# Patient Record
Sex: Male | Born: 1975 | Hispanic: Yes | Marital: Single | Smoking: Never smoker
Health system: Southern US, Community
[De-identification: ages and names within clinical notes are randomized; demographics above are authoritative.]

## PROBLEM LIST (undated history)

## (undated) DIAGNOSIS — J45909 Unspecified asthma, uncomplicated: Secondary | ICD-10-CM

---

## 2017-05-02 ENCOUNTER — Emergency Department: Payer: Worker's Compensation

## 2017-05-02 ENCOUNTER — Emergency Department
Admission: EM | Admit: 2017-05-02 | Discharge: 2017-05-02 | Disposition: A | Discharge status: Home / Self Care | Payer: Worker's Compensation | Attending: Emergency Medicine | Admitting: Emergency Medicine

## 2017-05-02 ENCOUNTER — Other Ambulatory Visit: Payer: Self-pay

## 2017-05-02 DIAGNOSIS — S61219A Laceration without foreign body of unspecified finger without damage to nail, initial encounter: Secondary | ICD-10-CM

## 2017-05-02 DIAGNOSIS — Y929 Unspecified place or not applicable: Secondary | ICD-10-CM | POA: Insufficient documentation

## 2017-05-02 DIAGNOSIS — S67196A Crushing injury of right little finger, initial encounter: Secondary | ICD-10-CM | POA: Diagnosis present

## 2017-05-02 DIAGNOSIS — S6710XA Crushing injury of unspecified finger(s), initial encounter: Secondary | ICD-10-CM

## 2017-05-02 DIAGNOSIS — Y939 Activity, unspecified: Secondary | ICD-10-CM | POA: Diagnosis not present

## 2017-05-02 DIAGNOSIS — Y990 Civilian activity done for income or pay: Secondary | ICD-10-CM | POA: Diagnosis not present

## 2017-05-02 DIAGNOSIS — W230XXA Caught, crushed, jammed, or pinched between moving objects, initial encounter: Secondary | ICD-10-CM | POA: Insufficient documentation

## 2017-05-02 DIAGNOSIS — S61216A Laceration without foreign body of right little finger without damage to nail, initial encounter: Secondary | ICD-10-CM | POA: Insufficient documentation

## 2017-05-02 MED ORDER — BUPIVACAINE HCL 0.5 % IJ SOLN
50.0000 mL | Freq: Once | INTRAMUSCULAR | Status: AC
  Administered 2017-05-02: 30 mL
  Filled 2017-05-02: qty 50

## 2017-05-02 MED ORDER — CEPHALEXIN 500 MG PO CAPS: 500.0000 mg | ORAL_CAPSULE | Freq: Two times a day (BID) | ORAL | 0 refills | Status: AC

## 2017-05-02 MED ORDER — SULFAMETHOXAZOLE-TRIMETHOPRIM 800-160 MG PO TABS: 1.0000 | ORAL_TABLET | Freq: Two times a day (BID) | ORAL | 0 refills | Status: AC

## 2017-05-02 MED ORDER — LIDOCAINE HCL (PF) 1 % IJ SOLN
5.0000 mL | Freq: Once | INTRAMUSCULAR | Status: AC
  Administered 2017-05-02: 5 mL via INTRADERMAL
  Filled 2017-05-02: qty 5

## 2017-05-02 MED ORDER — TETANUS-DIPHTH-ACELL PERTUSSIS 5-2.5-18.5 LF-MCG/0.5 IM SUSP
0.5000 mL | Freq: Once | INTRAMUSCULAR | Status: AC
  Administered 2017-05-02: 0.5 mL via INTRAMUSCULAR
  Filled 2017-05-02: qty 0.5

## 2017-05-02 MED ORDER — NAPROXEN 500 MG PO TABS: 500.0000 mg | ORAL_TABLET | Freq: Two times a day (BID) | ORAL | 0 refills | Status: AC

## 2017-05-02 MED ORDER — BUPIVACAINE HCL (PF) 0.5 % IJ SOLN
INTRAMUSCULAR | Status: DC
Start: 2017-05-02 — End: 2017-05-02
  Filled 2017-05-02: qty 30

## 2017-05-02 NOTE — ED Notes (Signed)
See triage note  Injury noted to r hand  Laceration noted to right 5 th finger

## 2017-05-02 NOTE — ED Triage Notes (Signed)
Pt states he got his right 5th finger smashed in a machine while at work..Marland Kitchen

## 2017-05-02 NOTE — ED Provider Notes (Signed)
[facility] Emergency Department Provider Note ____________________________________________  Time seen: Approximately 8:10 AM  I have reviewed the triage vital signs and the nursing notes.   HISTORY  Chief Complaint Hand Injury    HPI Darrell Lee Appenzeller is a 41 y.Lee. male who presents to the emergency department for evaluation and treatment of right pinky finger injury while at work.  His finger was crushed in a machine.  He has 2 lacerations to the pad of his finger.  No obvious deformity.  He is unsure of the last tetanus immunization. History reviewed. No pertinent past medical history.  There are no active problems to display for this patient.   History reviewed. No pertinent surgical history.  Prior to Admission medications   Medication Sig Start Date End Date Taking? Authorizing Provider  cephALEXin (KEFLEX) 500 MG capsule Take 1 capsule (500 mg total) by mouth 2 (two) times daily for 10 days. 05/02/17 05/12/17  Hosey Burmester, Rulon Eisenmengerari B, FNP  naproxen (NAPROSYN) 500 MG tablet Take 1 tablet (500 mg total) by mouth 2 (two) times daily with a meal. 05/02/17   Solana Coggin B, FNP  sulfamethoxazole-trimethoprim (BACTRIM DS,SEPTRA DS) 800-160 MG tablet Take 1 tablet by mouth 2 (two) times daily. 05/02/17   Chinita Pesterriplett, Graviel Payeur B, FNP    Allergies Patient has no known allergies.  No family history on file.  Social History Social History   Tobacco Use  . Smoking status: Not on file  Substance Use Topics  . Alcohol use: Not on file  . Drug use: Not on file    Review of Systems Constitutional: Negative for recent illness Cardiovascular: Negative for chest pain Respiratory: Negative for shortness of breath Musculoskeletal: Positive for right finger injury Skin: Positive for laceration to the finger pad of the right small finger Neurological: Negative for numbness or paresthesias  ____________________________________________   PHYSICAL EXAM:  VITAL  SIGNS: ED Triage Vitals  Enc Vitals Group     BP 05/02/17 0758 (!) 156/92     Pulse Rate 05/02/17 0758 70     Resp 05/02/17 0758 18     Temp 05/02/17 0758 98.2 F (36.8 C)     Temp Source 05/02/17 0758 Oral     SpO2 05/02/17 0758 96 %     Weight --      Height --      Head Circumference --      Peak Flow --      Pain Score 05/02/17 0755 9     Pain Loc --      Pain Edu? --      Excl. in GC? --     Constitutional: Alert and oriented. Well appearing and in no acute distress. Eyes: Conjunctivae are clear without discharge or drainage Head: Atraumatic Neck: Supple, full range of motion observed. Respiratory: Respirations even and unlabored. Musculoskeletal: Full range of motion of the right hand specifically the right pinky finger. Neurologic: Sensation intact.  Skin: (2) 1 cm lacerations to the medial and lateral aspect of the finger pad of the right pinky finger  Psychiatric: Behavior and affect are normal.  ____________________________________________   LABS (all labs ordered are listed, but only abnormal results are displayed)  Labs Reviewed - No data to display ____________________________________________  RADIOLOGY  No acute fracture of the fifth finger on the right hand per radiology. I, Kem Boroughsari Reynard Christoffersen, personally viewed and evaluated these images (plain radiographs) as part of my medical decision making, as well as reviewing the written report by the  radiologist.  ___________________________________________   PROCEDURES  .Marland Kitchen.Laceration Repair Date/Time: 05/02/2017 9:17 AM Performed by: Chinita Pesterriplett, Suhailah Kwan B, FNP Authorized by: Chinita Pesterriplett, Lamondre Wesche B, FNP   Consent:    Consent obtained:  Verbal   Consent given by:  Patient   Risks discussed:  Pain Anesthesia (see MAR for exact dosages):    Anesthesia method:  Nerve block   Block location:  Digital nerve   Block needle gauge:  25 G   Block anesthetic:  Bupivacaine 0.5% WITH epi   Block injection procedure:  Anatomic  landmarks identified and negative aspiration for blood    ____________________________________________   INITIAL IMPRESSION / ASSESSMENT AND PLAN / ED COURSE  Darrell Lee Bunkley is a 41 y.Lee. male who presents to the emergency department after sustaining a crush injury to his right hand.  X-ray negative for acute bony abnormality per radiology.  Sutures were inserted and the patient was given wound care instructions.  He will also be placed on Bactrim and Keflex due to the nature of the injury.  He was encouraged to have the sutures removed in 10 days.  He was advised to return return to the emergency department or see orthopedics for symptoms of concern.  Medications  lidocaine (PF) (XYLOCAINE) 1 % injection 5 mL (5 mLs Intradermal Given 05/02/17 0853)  bupivacaine (MARCAINE) 0.5 % (with pres) injection 50 mL (30 mLs Infiltration Given 05/02/17 0853)  Tdap (BOOSTRIX) injection 0.5 mL (0.5 mLs Intramuscular Given 05/02/17 1004)    Pertinent labs & imaging results that were available during my care of the patient were reviewed by me and considered in my medical decision making (see chart for details).  _________________________________________   FINAL CLINICAL IMPRESSION(S) / ED DIAGNOSES  Final diagnoses:  Crush injury to finger, initial encounter  Laceration of finger of right hand without damage to nail, initial encounter    ED Discharge Orders        Ordered    cephALEXin (KEFLEX) 500 MG capsule  2 times daily     05/02/17 1022    sulfamethoxazole-trimethoprim (BACTRIM DS,SEPTRA DS) 800-160 MG tablet  2 times daily     05/02/17 1022    naproxen (NAPROSYN) 500 MG tablet  2 times daily with meals     05/02/17 1022       If controlled substance prescribed during this visit, 12 month history viewed on the [facility] prior to issuing an initial prescription for Schedule II or III opiod.    Chinita Pesterriplett, Macy Lingenfelter B, FNP 05/02/17 1028    Governor RooksLord, Rebecca, MD 05/02/17 [phone]

## 2017-05-02 NOTE — Discharge Instructions (Signed)
Do not get the sutured area wet for 24 hours. After 24 hours, shower/bathe as usual and pat the area dry. Change the bandage 2 times per day and apply antibiotic ointment. Leave open to air when at no risk of getting the area dirty, but cover at night before bed. Sutures need to be removed in 10 days.

## 2017-05-12 ENCOUNTER — Emergency Department
Admission: EM | Admit: 2017-05-12 | Discharge: 2017-05-12 | Disposition: A | Discharge status: Home / Self Care | Payer: Worker's Compensation | Attending: Emergency Medicine | Admitting: Emergency Medicine

## 2017-05-12 ENCOUNTER — Encounter: Payer: Self-pay | Admitting: Emergency Medicine

## 2017-05-12 DIAGNOSIS — Z79899 Other long term (current) drug therapy: Secondary | ICD-10-CM | POA: Insufficient documentation

## 2017-05-12 DIAGNOSIS — Y33XXXD Other specified events, undetermined intent, subsequent encounter: Secondary | ICD-10-CM | POA: Insufficient documentation

## 2017-05-12 DIAGNOSIS — S61217D Laceration without foreign body of left little finger without damage to nail, subsequent encounter: Secondary | ICD-10-CM | POA: Diagnosis not present

## 2017-05-12 DIAGNOSIS — Z4802 Encounter for removal of sutures: Secondary | ICD-10-CM | POA: Insufficient documentation

## 2017-05-12 NOTE — ED Triage Notes (Signed)
Right 5th finger suture removal

## 2017-05-12 NOTE — ED Provider Notes (Signed)
[facility] Emergency Department Provider Note   ____________________________________________   First MD Initiated Contact with Patient 05/12/17 [id number]     (approximate)  I have reviewed the triage vital signs and the nursing notes.   HISTORY  Chief Complaint Suture / Staple Removal    HPI Darrell Lee is a 41 y.o. male patient presents for suture removal from the fifth digit left hand secondary to laceration 10 days ago. Patient denies loss sensation or loss of function of the digit. Patient denies any discharge or signs of infection.He denies pain.   History reviewed. No pertinent past medical history.  There are no active problems to display for this patient.   History reviewed. No pertinent surgical history.  Prior to Admission medications   Medication Sig Start Date End Date Taking? Authorizing Provider  cephALEXin (KEFLEX) 500 MG capsule Take 1 capsule (500 mg total) by mouth 2 (two) times daily for 10 days. 05/02/17 05/12/17  Triplett, Rulon Eisenmengerari B, FNP  naproxen (NAPROSYN) 500 MG tablet Take 1 tablet (500 mg total) by mouth 2 (two) times daily with a meal. 05/02/17   Triplett, Cari B, FNP  sulfamethoxazole-trimethoprim (BACTRIM DS,SEPTRA DS) 800-160 MG tablet Take 1 tablet by mouth 2 (two) times daily. 05/02/17   Chinita Pesterriplett, Cari B, FNP    Allergies Patient has no known allergies.  History reviewed. No pertinent family history.  Social History Social History   Tobacco Use  . Smoking status: Not on file  Substance Use Topics  . Alcohol use: Not on file  . Drug use: Not on file    Review of Systems Constitutional: No fever/chills Eyes: No visual changes. ENT: No sore throat. Cardiovascular: Denies chest pain. Respiratory: Denies shortness of breath. Gastrointestinal: No abdominal pain.  No nausea, no vomiting.  No diarrhea.  No constipation. Genitourinary: Negative for dysuria. Musculoskeletal: Negative for back pain. Skin:  Negative for rash. Healing finger laceration Neurological: Negative for headaches, focal weakness or numbness.   ____________________________________________   PHYSICAL EXAM:  VITAL SIGNS: ED Triage Vitals  Enc Vitals Group     BP 05/12/17 0812 (!) 142/92     Pulse Rate 05/12/17 0812 (!) 54     Resp 05/12/17 0812 18     Temp 05/12/17 0812 98.1 F (36.7 C)     Temp Source 05/12/17 0812 Oral     SpO2 05/12/17 0812 96 %     Weight 05/12/17 0814 208 lb 12.4 oz (94.7 kg)     Height --      Head Circumference --      Peak Flow --      Pain Score 05/12/17 0812 0     Pain Loc --      Pain Edu? --      Excl. in GC? --     Constitutional: Alert and oriented. Well appearing and in no acute distress. Cardiovascular: Normal rate, regular rhythm. Grossly normal heart sounds.  Good peripheral circulation. Respiratory: Normal respiratory effort.  No retractions. Lungs CTAB. Neurologic:  Normal speech and language. No gross focal neurologic deficits are appreciated. No gait instability. Skin: Skin is intact with 9 interrupted sutures on the fifth digit left hand. Psychiatric: Mood and affect are normal. Speech and behavior are normal.  ____________________________________________   LABS (all labs ordered are listed, but only abnormal results are displayed)  Labs Reviewed - No data to display ____________________________________________  EKG   ____________________________________________  RADIOLOGY  No results found.  ____________________________________________   PROCEDURES  Procedure(s) performed: None  Procedures  Critical Care performed: No  ____________________________________________   INITIAL IMPRESSION / ASSESSMENT AND PLAN / ED COURSE  As part of my medical decision making, I reviewed the following data within the electronic MEDICAL RECORD NUMBER    Healed laceration fifth digit right hand. Suture removal and wound clean and bandage. Patient given discharge  care instructions. Patient advised return by the ED wound reopens.      ____________________________________________   FINAL CLINICAL IMPRESSION(S) / ED DIAGNOSES  Final diagnoses:  Encounter for staple removal     ED Discharge Orders    None       Note:  This document was prepared using Dragon voice recognition software and may include unintentional dictation errors.    Joni ReiningSmith, Erdine Hulen K, PA-C 05/12/17 0845    Arnaldo NatalMalinda, Paul F, MD 05/12/17 [phone]

## 2019-09-22 IMAGING — DX DG HAND COMPLETE 3+V*R*
3 series · 3 of 3 positions shown · non-contrast
Comparison: None in PACs

CLINICAL DATA: Crush injury of the distal aspect of the fifth
finger at work today.

EXAM:
RIGHT HAND - COMPLETE 3+ VIEW

[hand ap]
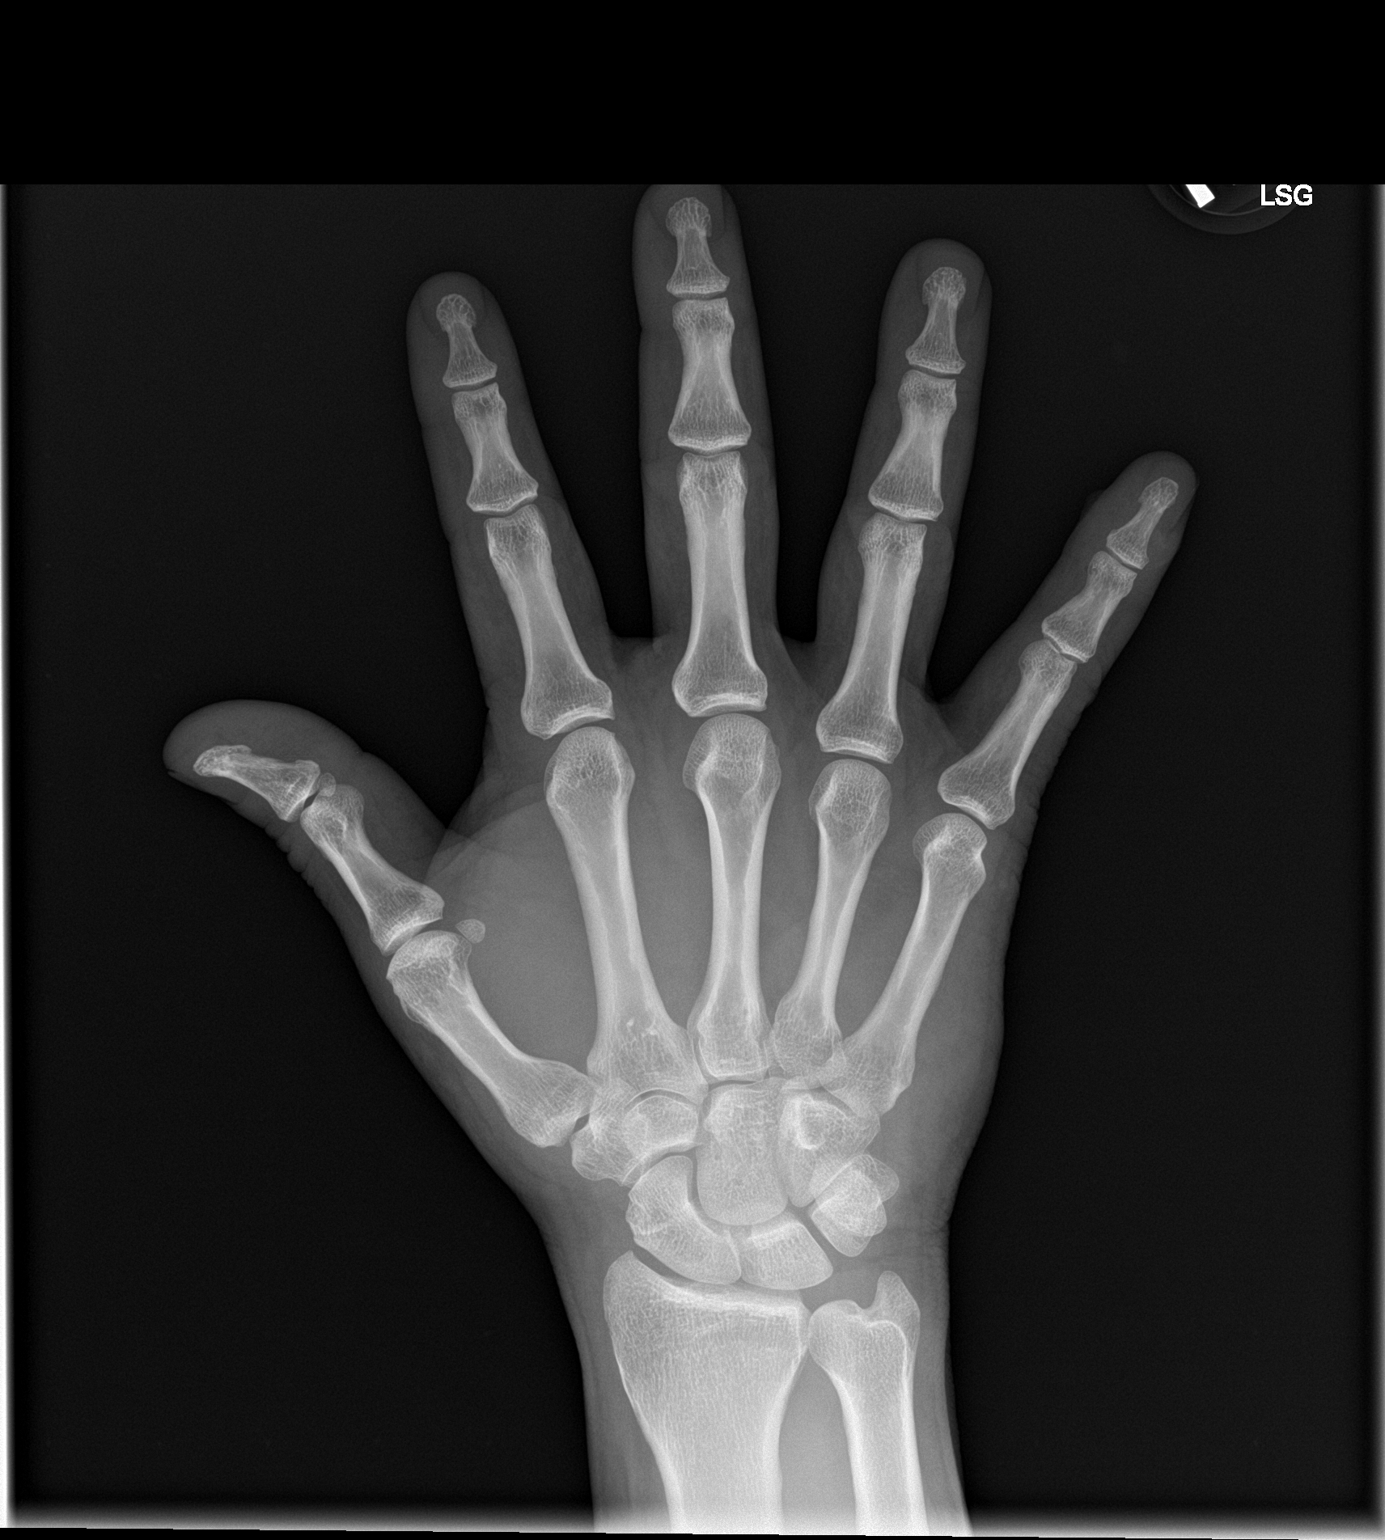

[hand obl]
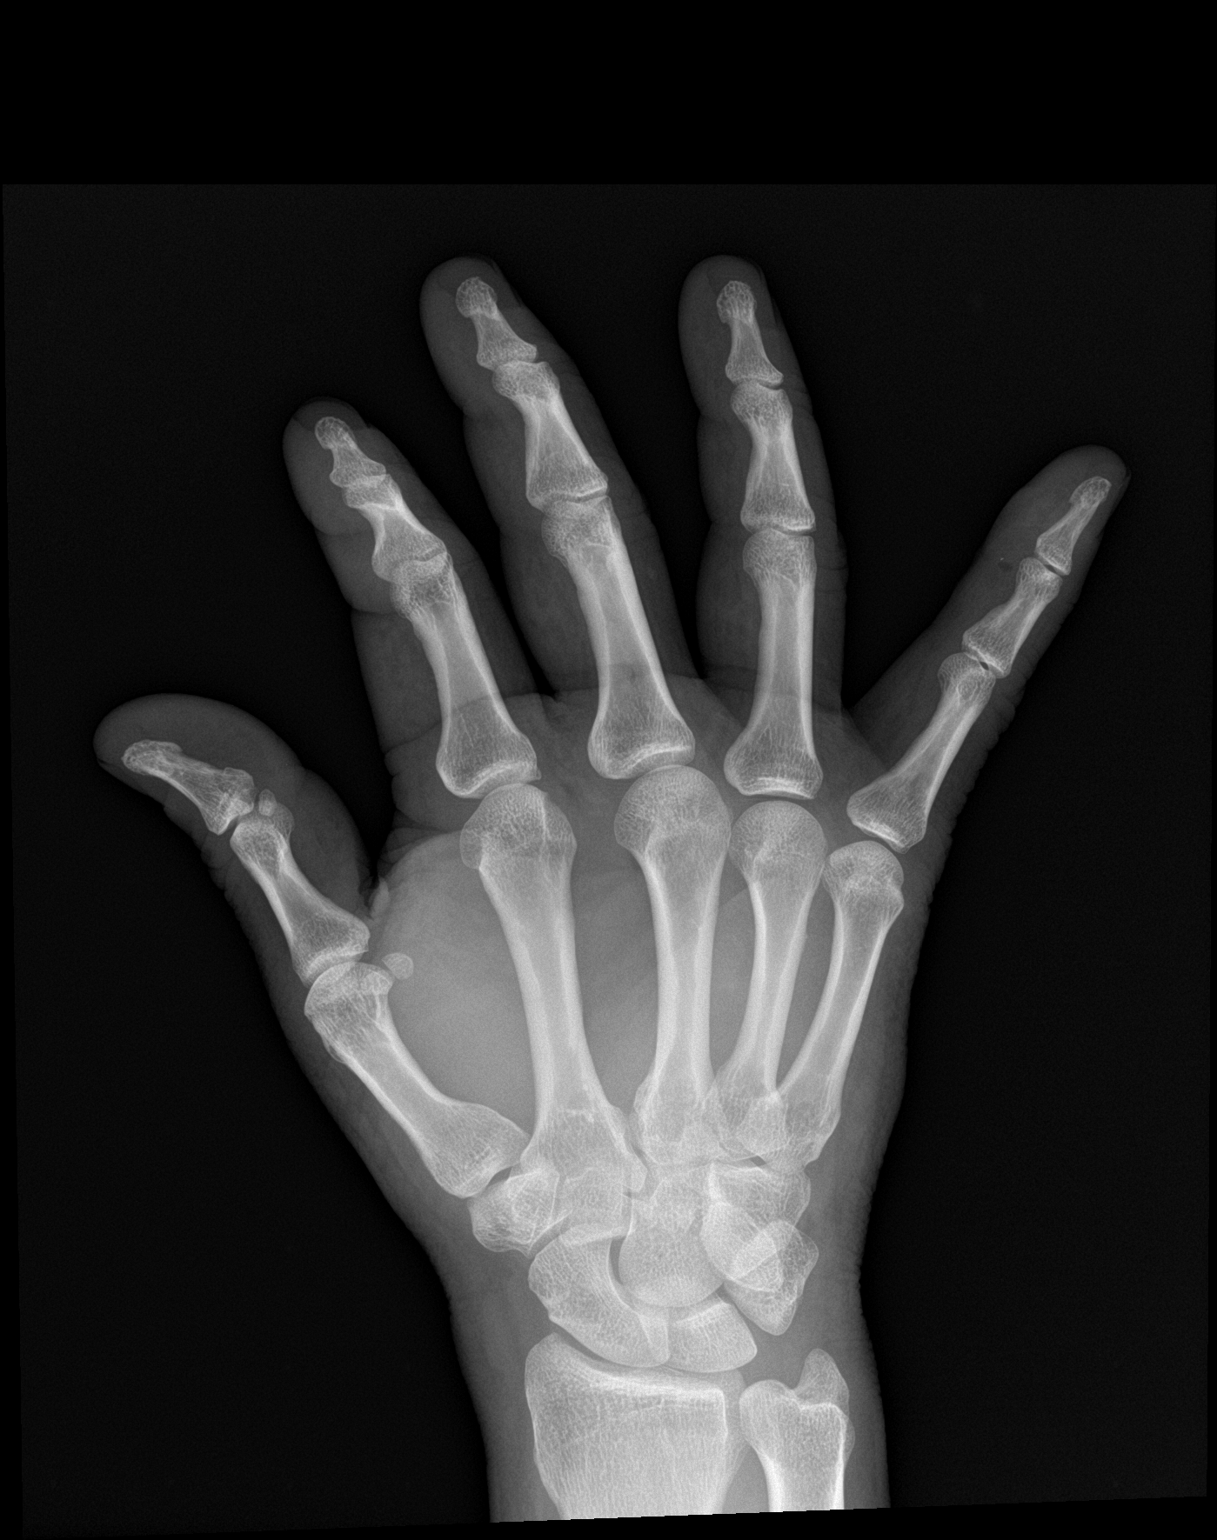

[hand lat]
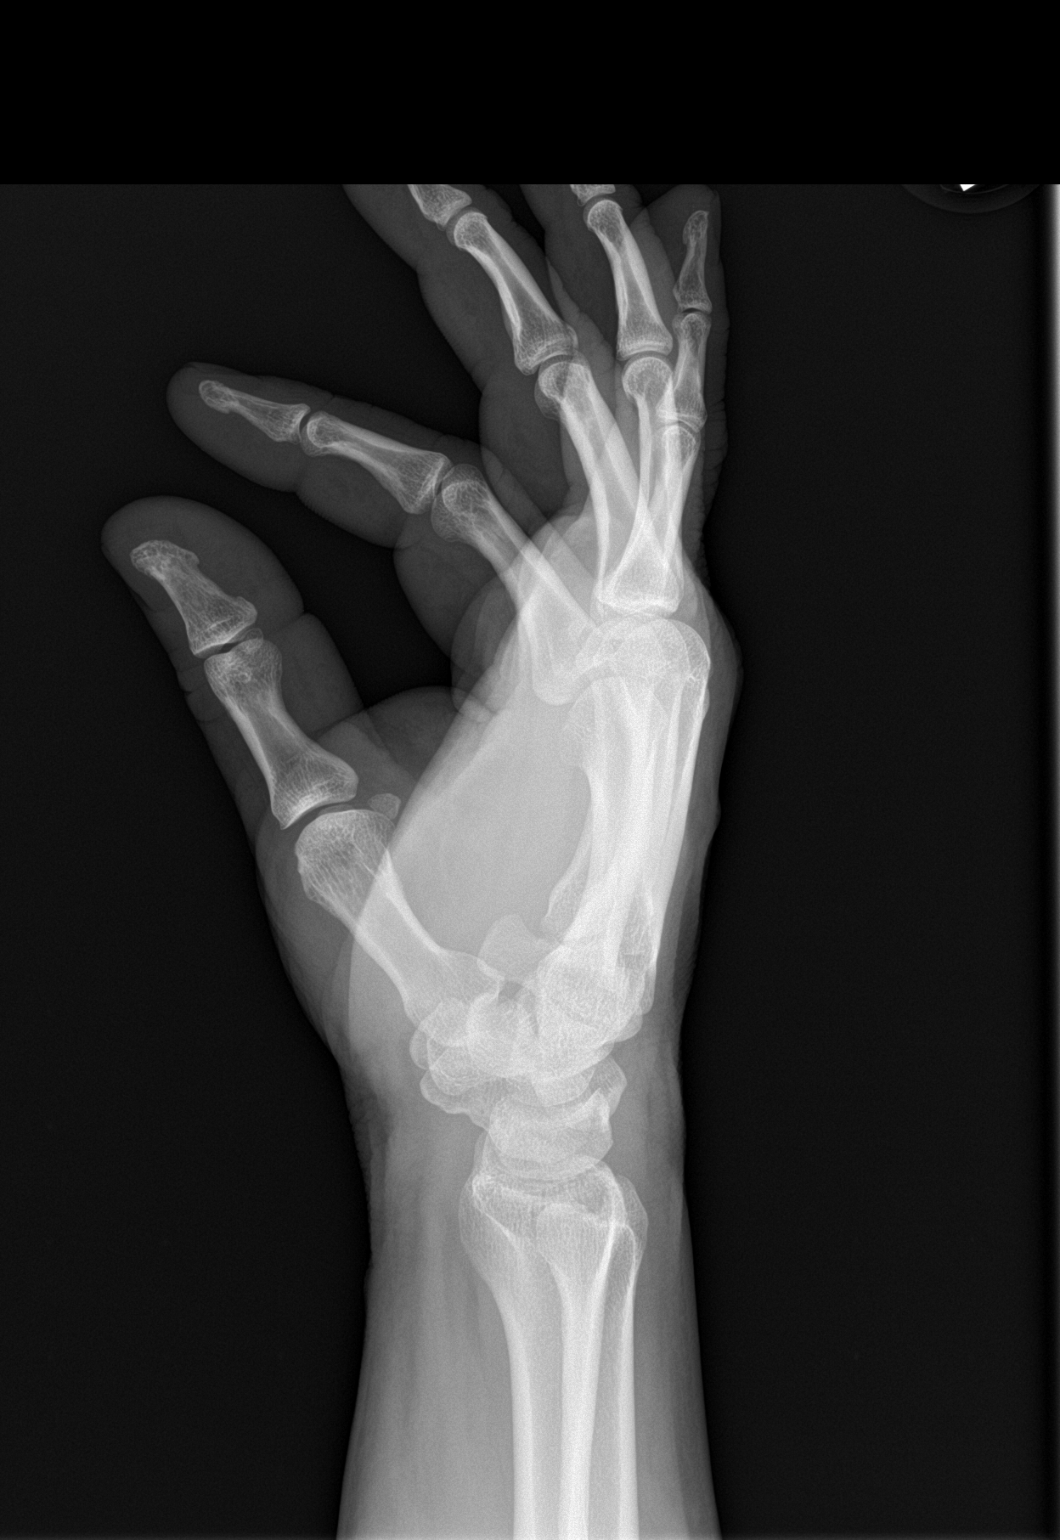

[3 of 3 positions shown; findings below may reference images not displayed]

FINDINGS: There is soft tissue swelling over the distal [DATE] of the fifth
finger. The bones are subjectively adequately mineralized and are
intact. There is small amount of soft tissue gas due to disruption
of the soft tissues. A 0.5 mm radiodensity is present within the
soft tissues and is visible on one view only. The other phalanges
are intact. The metacarpals and carpals are intact.
IMPRESSION: There is no acute fracture or dislocation of the fifth finger. There
is soft tissue swelling and evidence of soft tissue disruption over
the distal aspect of the fifth finger.

## 2021-12-20 ENCOUNTER — Ambulatory Visit: Payer: Self-pay | Admitting: Podiatry

## 2022-10-25 ENCOUNTER — Emergency Department: Payer: Worker's Compensation

## 2022-10-25 ENCOUNTER — Emergency Department
Admission: EM | Admit: 2022-10-25 | Discharge: 2022-10-25 | Disposition: A | Discharge status: Home / Self Care | Payer: Worker's Compensation | Attending: Student in an Organized Health Care Education/Training Program | Admitting: Student in an Organized Health Care Education/Training Program

## 2022-10-25 DIAGNOSIS — Y990 Civilian activity done for income or pay: Secondary | ICD-10-CM | POA: Insufficient documentation

## 2022-10-25 DIAGNOSIS — Z23 Encounter for immunization: Secondary | ICD-10-CM | POA: Diagnosis not present

## 2022-10-25 DIAGNOSIS — W275XXA Contact with paper-cutter, initial encounter: Secondary | ICD-10-CM | POA: Insufficient documentation

## 2022-10-25 DIAGNOSIS — S61412A Laceration without foreign body of left hand, initial encounter: Secondary | ICD-10-CM | POA: Diagnosis present

## 2022-10-25 MED ORDER — TETANUS-DIPHTH-ACELL PERTUSSIS 5-2.5-18.5 LF-MCG/0.5 IM SUSY
0.5000 mL | PREFILLED_SYRINGE | Freq: Once | INTRAMUSCULAR | Status: AC
  Administered 2022-10-25: 0.5 mL via INTRAMUSCULAR
  Filled 2022-10-25: qty 0.5

## 2022-10-25 MED ORDER — LIDOCAINE-EPINEPHRINE 2 %-1:100000 IJ SOLN
20.0000 mL | Freq: Once | INTRAMUSCULAR | Status: DC
  Filled 2022-10-25: qty 1

## 2022-10-25 NOTE — Discharge Instructions (Signed)
You were evaluated in the emergency department for a laceration. It was repaired with sutures. Keep the area clean and dry.  Wash multiple times per day with soap and water.  Do not go into the ocean or swimming pool.  Return to the emergency department or your primary care physician's office in 7 days for suture removal.  Return to the emergency department for:  -- Fever > 100.4F -- Increase pain in the wound -- Increase redness and swelling -- Pus coming from the wound -- Wound bleeds more than a small amount or it does not stop -- Wound edges come apart -- Severe pain -- Weakness or numbness in the affected area  Or any other new or worsening symptoms. It was a pleasure caring for you.  

## 2022-10-25 NOTE — ED Provider Notes (Signed)
[facility] Provider Note    Event Date/Time   First MD Initiated Contact with Patient 10/25/22 1426     (approximate)   History   Laceration   HPI  Darrell Lee is a 47 y.o. male who presents today for evaluation of left hand laceration.  Patient reports that he was opening a box at work and accidentally sliced his left hand.  He is unsure of his last tetanus shot.  Denies numbness or tingling.  There are no problems to display for this patient.         Physical Exam   Triage Vital Signs: ED Triage Vitals [10/25/22 1431]  Enc Vitals Group     BP (!) 158/96     Pulse Rate 88     Resp 18     Temp 99 F (37.2 C)     Temp Source Oral     SpO2 95 %     Weight 190 lb (86.2 kg)     Height 5\' 8"  (1.727 m)     Head Circumference      Peak Flow      Pain Score 0     Pain Loc      Pain Edu?      Excl. in GC?     Most recent vital signs: Vitals:   10/25/22 1431  BP: (!) 158/96  Pulse: 88  Resp: 18  Temp: 99 F (37.2 C)  SpO2: 95%    Physical Exam Vitals and nursing note reviewed.  Constitutional:      General: Awake and alert. No acute distress.    Appearance: Normal appearance. The patient is normal weight.  HENT:     Head: Normocephalic and atraumatic.     Mouth: Mucous membranes are moist.  Eyes:     General: PERRL. Normal EOMs        Right eye: No discharge.        Left eye: No discharge.     Conjunctiva/sclera: Conjunctivae normal.  Cardiovascular:     Rate and Rhythm: Normal rate and regular rhythm.     Pulses: Normal pulses.  Pulmonary:     Effort: Pulmonary effort is normal. No respiratory distress.     Breath sounds: Normal breath sounds.  Abdominal:     Abdomen is soft. There is no abdominal tenderness. No rebound or guarding. No distention. Musculoskeletal:        General: No swelling. Normal range of motion.     Cervical back: Normal range of motion and neck supple.  Left hand: 5cm laceration to the  palmar of the hand at the hypothenar eminence.  Able to flex and extend all fingers against resistance at isolated MCP, DIP, and PIP joint.  There is no foreign body noted.  No dorsal abnormalities.  Normal range of motion of the wrist.  Normal radial pulse. Skin:    General: Skin is warm and dry.     Capillary Refill: Capillary refill takes less than 2 seconds.     Findings: No rash.  Neurological:     Mental Status: The patient is awake and alert.      ED Results / Procedures / Treatments   Labs (all labs ordered are listed, but only abnormal results are displayed) Labs Reviewed - No data to display   EKG     RADIOLOGY I independently reviewed and interpreted imaging and agree with radiologists findings.     PROCEDURES:  Critical Care performed:   Marland KitchenMarland Kitchen  Laceration Repair  Date/Time: 10/25/2022 3:56 PM  Performed by: Jackelyn Hoehn, PA-C Authorized by: Jackelyn Hoehn, PA-C   Consent:    Consent obtained:  Verbal   Consent given by:  Patient   Risks, benefits, and alternatives were discussed: yes     Risks discussed:  Infection, need for additional repair, nerve damage, pain, poor cosmetic result, poor wound healing, retained foreign body, tendon damage and vascular damage   Alternatives discussed:  No treatment Universal protocol:    Procedure explained and questions answered to patient or proxy's satisfaction: yes     Relevant documents present and verified: yes     Test results available: yes     Imaging studies available: yes     Required blood products, implants, devices, and special equipment available: yes     Site/side marked: yes     Immediately prior to procedure, a time out was called: yes     Patient identity confirmed:  Verbally with patient Anesthesia:    Anesthesia method:  Local infiltration   Local anesthetic:  Lidocaine 1% WITH epi Laceration details:    Location:  Hand   Hand location:  L palm   Length (cm):  5   Depth (mm):  3 Pre-procedure  details:    Preparation:  Patient was prepped and draped in usual sterile fashion and imaging obtained to evaluate for foreign bodies Exploration:    Limited defect created (wound extended): no     Hemostasis achieved with:  Direct pressure   Imaging obtained: x-ray     Imaging outcome: foreign body not noted     Wound exploration: wound explored through full range of motion and entire depth of wound visualized     Wound extent: areolar tissue not violated, fascia not violated, no foreign body, no signs of injury, no nerve damage, no tendon damage, no underlying fracture and no vascular damage     Contaminated: no   Treatment:    Area cleansed with:  Povidone-iodine and saline   Amount of cleaning:  Extensive   Irrigation solution:  Sterile saline, sterile water and tap water   Irrigation method:  Pressure wash, syringe and tap   Debridement:  None   Undermining:  None   Scar revision: no   Skin repair:    Repair method:  Sutures   Suture size:  5-0   Suture material:  Nylon   Suture technique:  Simple interrupted   Number of sutures:  9 Approximation:    Approximation:  Close Repair type:    Repair type:  Intermediate Post-procedure details:    Dressing:  Sterile dressing and non-adherent dressing   Procedure completion:  Tolerated well, no immediate complications    MEDICATIONS ORDERED IN ED: Medications  lidocaine-EPINEPHrine (XYLOCAINE W/EPI) 2 %-1:100000 (with pres) injection 20 mL (has no administration in time range)  Tdap (BOOSTRIX) injection 0.5 mL (0.5 mLs Intramuscular Given 10/25/22 1501)     IMPRESSION / MDM / ASSESSMENT AND PLAN / ED COURSE  I reviewed the triage vital signs and the nursing notes.   Differential diagnosis includes, but is not limited to, laceration, tendon injury, retained foreign body.  Patient is awake and alert, hemodynamically stable and afebrile.  He is nontoxic in appearance.  He has full normal range of motion of all of his fingers, I  do not suspect tendon injury.  The area was anesthetized, irrigated extensively, and closed with sutures.  Discussed suture care, and removal.  Discussed return precautions  the importance of close outpatient follow-up.  His tetanus was updated.  He was given a work note as requested.  He understands wound care and return precautions.  Patient understands and agrees with plan.  He was discharged in stable condition.   Patient's presentation is most consistent with acute complicated illness / injury requiring diagnostic workup.    FINAL CLINICAL IMPRESSION(S) / ED DIAGNOSES   Final diagnoses:  Laceration of left hand without foreign body, initial encounter     Rx / DC Orders   ED Discharge Orders     None        Note:  This document was prepared using Dragon voice recognition software and may include unintentional dictation errors.   Keturah Shavers 10/25/22 1728    Willy Eddy, MD 10/25/22 1745

## 2022-10-25 NOTE — ED Triage Notes (Signed)
Pt BIBA w c/o laceration to left hand via a box cutter. Bleeding controlled, ROM in fingers intact actively.

## 2022-11-02 ENCOUNTER — Other Ambulatory Visit: Payer: Self-pay

## 2022-11-02 ENCOUNTER — Emergency Department
Admission: EM | Admit: 2022-11-02 | Discharge: 2022-11-02 | Disposition: A | Discharge status: Home / Self Care | Payer: Worker's Compensation | Attending: Emergency Medicine | Admitting: Emergency Medicine

## 2022-11-02 DIAGNOSIS — Z4802 Encounter for removal of sutures: Secondary | ICD-10-CM | POA: Insufficient documentation

## 2022-11-02 DIAGNOSIS — Z5189 Encounter for other specified aftercare: Secondary | ICD-10-CM

## 2022-11-02 HISTORY — DX: Unspecified asthma, uncomplicated: J45.909

## 2022-11-02 MED ORDER — CEPHALEXIN 500 MG PO CAPS: 500.0000 mg | ORAL_CAPSULE | Freq: Four times a day (QID) | ORAL | 0 refills | Status: AC

## 2022-11-02 NOTE — ED Provider Notes (Signed)
[facility] Provider Note    Event Date/Time   First MD Initiated Contact with Patient 11/02/22 1043     (approximate)   History   Suture / Staple Removal   HPI  Darrell Lee is a 47 y.o. male who presents today for request for suture removal.  Patient came to the emergency department on 10/25/2022 after he sustained a laceration to his hand from a box cutter.  Patient reports that he has not been washing his hand is much as he should, but reports that he has not had any complaints with his hand.  He denies any pain in the area.  No numbness or tingling.  Still able to use his hand normally.  No fevers.  There are no problems to display for this patient.         Physical Exam   Triage Vital Signs: ED Triage Vitals  Enc Vitals Group     BP 11/02/22 0936 (!) 174/109     Pulse Rate 11/02/22 0936 76     Resp 11/02/22 0936 18     Temp 11/02/22 0936 99 F (37.2 C)     Temp Source 11/02/22 0936 Oral     SpO2 11/02/22 0936 98 %     Weight 11/02/22 0937 190 lb (86.2 kg)     Height 11/02/22 0937 5\' 8"  (1.727 m)     Head Circumference --      Peak Flow --      Pain Score 11/02/22 0937 5     Pain Loc --      Pain Edu? --      Excl. in GC? --     Most recent vital signs: Vitals:   11/02/22 0936 11/02/22 1104  BP: (!) 174/109   Pulse: 76 77  Resp: 18   Temp: 99 F (37.2 C) 98.9 F (37.2 C)  SpO2: 98%     Physical Exam Vitals and nursing note reviewed.  Constitutional:      General: Awake and alert. No acute distress.    Appearance: Normal appearance. The patient is normal weight.  HENT:     Head: Normocephalic and atraumatic.     Mouth: Mucous membranes are moist.  Eyes:     General: PERRL. Normal EOMs        Right eye: No discharge.        Left eye: No discharge.     Conjunctiva/sclera: Conjunctivae normal.  Cardiovascular:     Rate and Rhythm: Normal rate and regular rhythm.     Pulses: Normal pulses.  Pulmonary:     Effort:  Pulmonary effort is normal. No respiratory distress.     Breath sounds: Normal breath sounds.  Abdominal:     Abdomen is soft. There is no abdominal tenderness. No rebound or guarding. No distention. Musculoskeletal:        General: No swelling. Normal range of motion.     Cervical back: Normal range of motion and neck supple.  Skin:    General: Skin is warm and dry.     Capillary Refill: Capillary refill takes less than 2 seconds.     Findings: 5 cm laceration to the palm of the left hand at the hypothenar eminence.  He is able to actively flex and extend all fingers against resistance at isolated MCP, DIP, and PIP.  There is very mild erythema noted extending approximately 0.5 cm at the ulnar end of the laceration.  It is nontender to  palpation.  There is no wound dehiscence.  There is no drainage.  Normal range of motion.  No swelling. Neurological:     Mental Status: The patient is awake and alert.      ED Results / Procedures / Treatments   Labs (all labs ordered are listed, but only abnormal results are displayed) Labs Reviewed - No data to display   EKG     RADIOLOGY     PROCEDURES:  Critical Care performed:   .Suture Removal  Date/Time: 11/02/2022 10:59 AM  Performed by: Jackelyn Hoehn, PA-C Authorized by: Jackelyn Hoehn, PA-C   Consent:    Consent obtained:  Verbal   Consent given by:  Patient   Risks, benefits, and alternatives were discussed: yes     Risks discussed:  Bleeding, pain and wound separation   Alternatives discussed:  No treatment Universal protocol:    Procedure explained and questions answered to patient or proxy's satisfaction: yes     Relevant documents present and verified: yes     Test results available: yes     Required blood products, implants, devices, and special equipment available: yes     Site/side marked: yes     Immediately prior to procedure, a time out was called: yes     Patient identity confirmed:  Verbally with  patient Location:    Location:  Upper extremity   Upper extremity location:  Hand   Hand location:  L hand Procedure details:    Wound appearance:  Good wound healing, clean, nontender, nonpurulent and pink   Number of sutures removed:  9   Number of staples removed:  0 Post-procedure details:    Procedure completion:  Tolerated well, no immediate complications    MEDICATIONS ORDERED IN ED: Medications - No data to display   IMPRESSION / MDM / ASSESSMENT AND PLAN / ED COURSE  I reviewed the triage vital signs and the nursing notes.   Differential diagnosis includes, but is not limited to, suture removal, cellulitis, induration.  Patient is awake and alert, hemodynamically stable and neurovascularly intact.  He has full and normal range of motion of all fingers and wrist.  His laceration has no wound dehiscence, no drainage, and is nontender to palpation.  Patient has no complaints.  However, the ulnar end of the laceration there is a small amount of erythema.  It is unclear if this is induration/scar tissue or skin irritation from the healing process, or if this is early cellulitis.  Will err on the conservative side and treat with 7 days of Keflex.  Patient is in agreement with this plan.  We discussed strict return precautions and the importance of close outpatient follow-up.  Patient or stands and agrees with plan.  He was discharged in stable condition.   Patient's presentation is most consistent with acute complicated illness / injury requiring diagnostic workup.      FINAL CLINICAL IMPRESSION(S) / ED DIAGNOSES   Final diagnoses:  Visit for suture removal  Visit for wound check     Rx / DC Orders   ED Discharge Orders          Ordered    cephALEXin (KEFLEX) 500 MG capsule  4 times daily        11/02/22 1050             Note:  This document was prepared using Dragon voice recognition software and may include unintentional dictation errors.   Jackelyn Hoehn, PA-C 11/02/22  1336    Minna Antis, MD 11/02/22 1535

## 2022-11-02 NOTE — ED Notes (Signed)
Dc instructions and scripts reviewed with pt no questions or concerns at this time.  

## 2022-11-02 NOTE — ED Triage Notes (Signed)
Pt to er, pt states that he is here to have some stitches removed and his wound evaluated, pt has stitches to his L hand.

## 2022-11-02 NOTE — Discharge Instructions (Signed)
Continue to keep your hands clean with soap and water.  Take the antibiotic as prescribed.  Return for any new, worsening, or change in symptoms or other concerns.  It was a pleasure caring for you today.
